# Patient Record
Sex: Male | Born: 1935 | Race: White | Hispanic: No | Marital: Single
Health system: Midwestern US, Academic
[De-identification: ages and names within clinical notes are randomized; demographics above are authoritative.]

---

## 2016-12-13 ENCOUNTER — Ambulatory Visit: Admit: 2016-12-13 | Discharge: 2016-12-14 | Payer: MEDICARE

## 2016-12-13 DIAGNOSIS — I4891 Unspecified atrial fibrillation: Secondary | ICD-10-CM

## 2016-12-13 DIAGNOSIS — Z950 Presence of cardiac pacemaker: Secondary | ICD-10-CM

## 2016-12-14 DIAGNOSIS — I495 Sick sinus syndrome: Principal | ICD-10-CM

## 2017-02-07 IMAGING — CR CHEST
1 series · 1 of 1 positions shown · non-contrast
Comparison: none

[chest port x-wise]
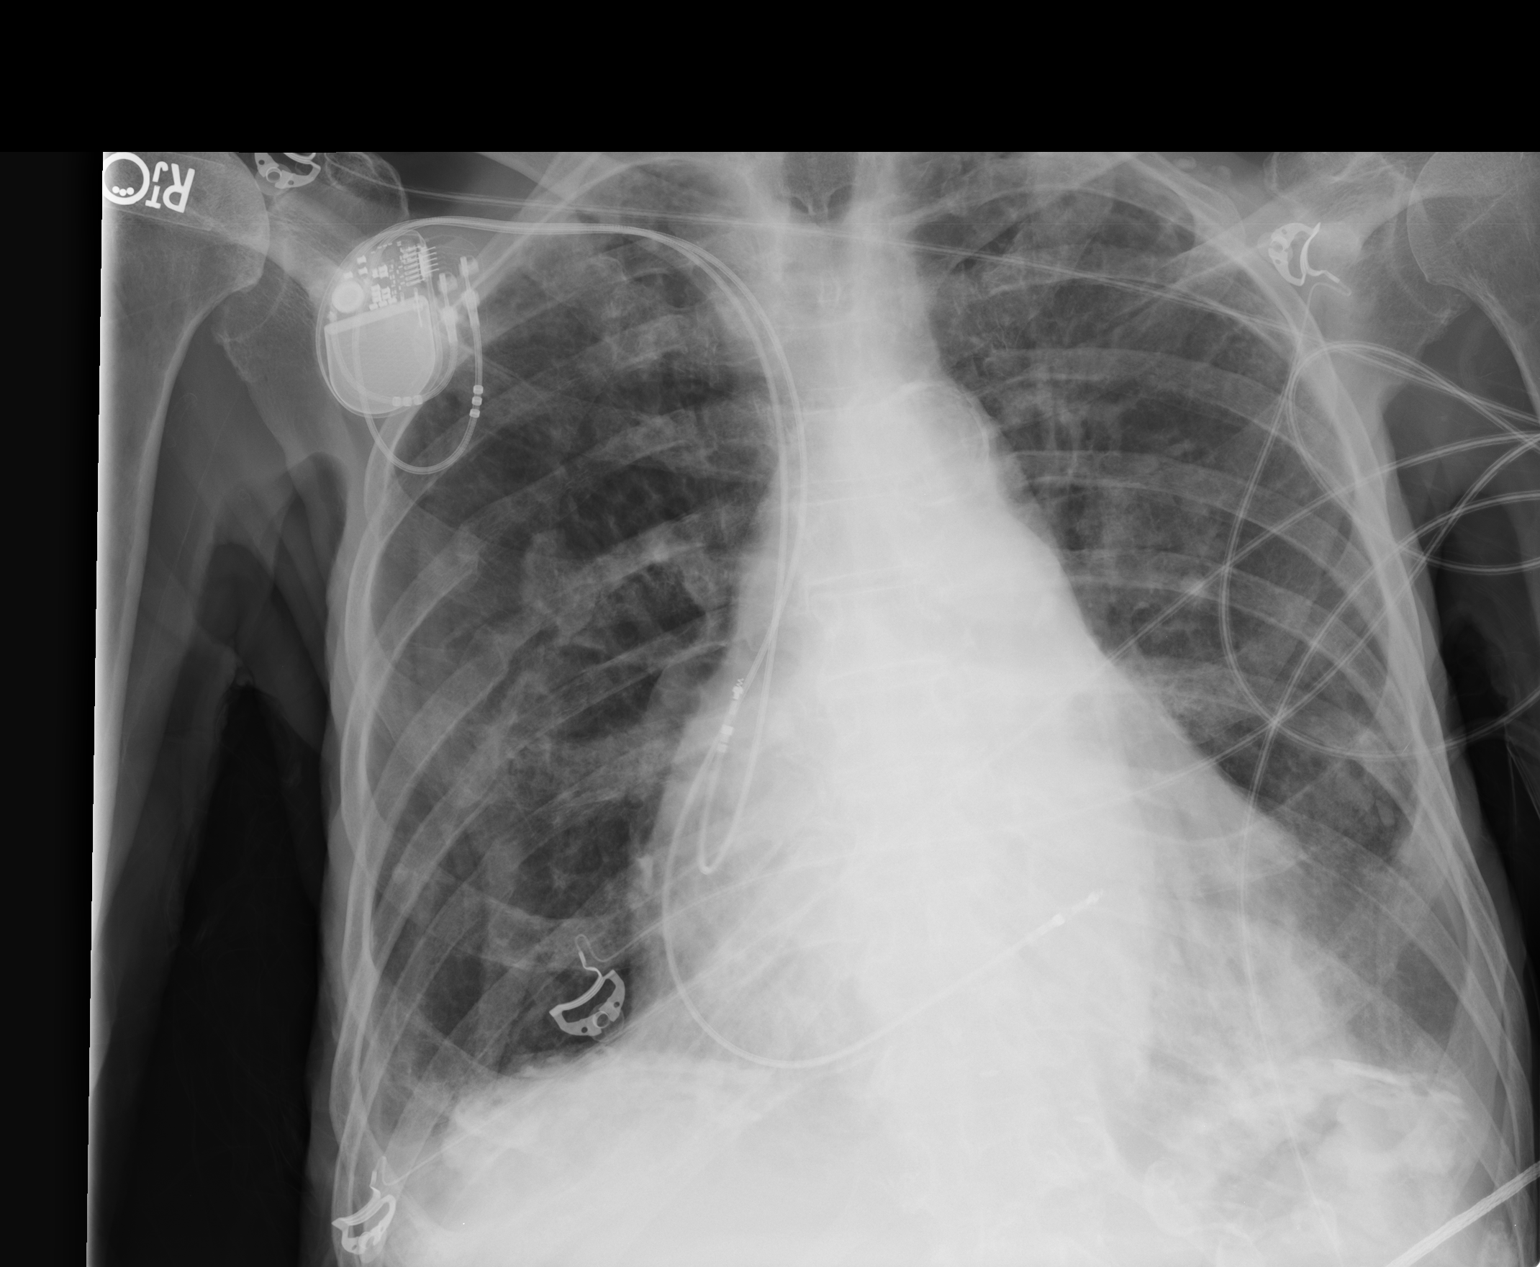

[1 of 1 positions shown; findings below may reference images not displayed]

EXAM

Chest x-ray.

INDICATION

marked anemia
ANEMIA. TJ

FINDINGS

A single portable view of the chest was obtained. The previous examination was reviewed from [DATE]/

There is cardiomegaly. There is mild pulmonary vascular congestion. There are interstitial and
patchy alveolar opacities throughout central and lower portion both lungs. There are old rib
fractures on the right with deformity.

Pacemaker and transvenous electrodes are present.

There is a small area of alveolar opacity in the left lower lobe with a few air bronchograms.

Pacemaker transvenous electrodes are present on the right.

There is pulmonary hyperinflation.

IMPRESSION

There is cardiomegaly with mild CHF. There is pulmonary hyperinflation. There is a small area of
alveolar opacity in the left lower lobe, most likely representing pneumonia.

## 2017-03-14 ENCOUNTER — Encounter: Admit: 2017-03-14 | Discharge: 2017-03-14 | Payer: MEDICARE

## 2017-03-14 ENCOUNTER — Ambulatory Visit: Admit: 2017-03-14 | Discharge: 2017-03-14 | Payer: MEDICARE

## 2017-03-14 DIAGNOSIS — Z950 Presence of cardiac pacemaker: ICD-10-CM

## 2017-03-14 DIAGNOSIS — I495 Sick sinus syndrome: Principal | ICD-10-CM

## 2017-03-14 DIAGNOSIS — I4891 Unspecified atrial fibrillation: ICD-10-CM

## 2017-03-14 NOTE — Telephone Encounter
I called Peter Frank the nurse at the [facility] called in regard patient Peter Frank. She states he is doing fair overall. He was up going to the restroom during the fast heart rate event today. He is still on Hospice care and they visit him once per week. He is still unable to come in for appointments. She will let us know if anything changes. WIll let RCP know. Peter Frank            [03/14/2017 10:09:52 AM - Ty Hilts, KAYLEE]  Dual chamber pacemaker interrogation via merlin from/reviewed today.     Presenting EGM shows AP-VS / JR vs frequent PVCs / and frequent conducted PACs ~60-115bpm    Since 01/18/16 Lead trends and battery within expected limits  ~6.7-8.50yrs remaining.  Device appears to be functioning as programmed.    AT/AF burden 38%  33% Vpaced  PVC: 1.9%    Events noted since last transmission:               Atrial:  389 new episodes 1 available for review from 10/25 @ 0753 for 6sec showing 6 sac of parox A tach peak A/V rates 186/174bpm.                 Ventricular:  8 new episodes all similar showing SVT w/1:1 and occasional 2:1 AV conduction peak V rates~177-190bpm.  Last on 9/3 @ 0610 for 25sec; longest on 8/24 @ 0723 for 5sec                 PMT: >900 new episodes 1 EGM available for review and it does appear to be true PMT    Remote monitoring remains connected and in place.   Full report with EGMs uploaded for more detailed review as needed (see attached)  Next full/in-clinic check expected~ 11/2016(overdue--> on 7/26 it was noted "Next follow up appt not scheduled. Pt has not been in office for year. However on Hospice care and there is note that pt will not be able to come into office." )  Routed to Dr Bradly Bienenstock for review/co-sign. (EP RNs flagged for f/u as needed).

## 2017-03-14 NOTE — Telephone Encounter
Peter Cornfield, MD  Jenel Lucks, RN   Caller: Unspecified (Today, 1:42 PM)            No, leave him alone    Previous Messages      ----- Message -----   From: Jenel Lucks, RN   Sent: 03/14/2017  1:55 PM   To: Peter Cornfield, MD     ----- Message from Jenel Lucks, RN sent at 03/14/2017 1:55 PM CDT -----   He is still on hospice, frail and somewhat stable. Any recs? See Lisette Abu notes below. He cannot come in for OV's at this point.

## 2017-06-13 ENCOUNTER — Ambulatory Visit: Admit: 2017-06-13 | Discharge: 2017-06-14 | Payer: MEDICARE

## 2017-06-13 DIAGNOSIS — I495 Sick sinus syndrome: Principal | ICD-10-CM

## 2017-06-14 DIAGNOSIS — I4891 Unspecified atrial fibrillation: ICD-10-CM

## 2017-06-14 DIAGNOSIS — Z950 Presence of cardiac pacemaker: ICD-10-CM

## 2017-08-22 ENCOUNTER — Encounter: Admit: 2017-08-22 | Discharge: 2017-08-22 | Payer: MEDICARE

## 2017-08-22 DIAGNOSIS — J449 Chronic obstructive pulmonary disease, unspecified: ICD-10-CM

## 2017-08-22 DIAGNOSIS — N183 Chronic kidney disease, stage 3 (moderate): ICD-10-CM

## 2017-08-22 DIAGNOSIS — E46 Unspecified protein-calorie malnutrition: ICD-10-CM

## 2017-08-22 DIAGNOSIS — N400 Enlarged prostate without lower urinary tract symptoms: ICD-10-CM

## 2017-08-22 DIAGNOSIS — I4891 Unspecified atrial fibrillation: ICD-10-CM

## 2017-08-22 DIAGNOSIS — I10 Essential (primary) hypertension: ICD-10-CM

## 2017-08-22 DIAGNOSIS — E119 Type 2 diabetes mellitus without complications: Principal | ICD-10-CM

## 2017-09-12 ENCOUNTER — Encounter: Admit: 2017-09-12 | Discharge: 2017-09-12 | Payer: MEDICARE

## 2017-09-12 ENCOUNTER — Ambulatory Visit: Admit: 2017-09-12 | Discharge: 2017-09-13 | Payer: MEDICARE

## 2017-09-12 DIAGNOSIS — I471 Supraventricular tachycardia: Secondary | ICD-10-CM

## 2017-09-12 DIAGNOSIS — Z950 Presence of cardiac pacemaker: ICD-10-CM

## 2017-09-12 DIAGNOSIS — I4891 Unspecified atrial fibrillation: Principal | ICD-10-CM

## 2017-09-13 DIAGNOSIS — I4891 Unspecified atrial fibrillation: ICD-10-CM

## 2017-09-13 DIAGNOSIS — Z950 Presence of cardiac pacemaker: ICD-10-CM

## 2017-09-13 DIAGNOSIS — I495 Sick sinus syndrome: Principal | ICD-10-CM

## 2017-10-19 DEATH — deceased

## 2017-12-20 ENCOUNTER — Encounter: Admit: 2017-12-20 | Discharge: 2017-12-20 | Payer: MEDICARE

## 2018-01-16 ENCOUNTER — Encounter: Admit: 2018-01-16 | Discharge: 2018-01-16 | Payer: MEDICARE
